# Patient Record
Sex: Female | Born: 1970
Health system: Southern US, Community
[De-identification: ages and names within clinical notes are randomized; demographics above are authoritative.]

## PROBLEM LIST (undated history)

## (undated) DIAGNOSIS — E669 Obesity, unspecified: Secondary | ICD-10-CM

## (undated) DIAGNOSIS — F419 Anxiety disorder, unspecified: Secondary | ICD-10-CM

## (undated) DIAGNOSIS — G2581 Restless legs syndrome: Secondary | ICD-10-CM

## (undated) DIAGNOSIS — E7800 Pure hypercholesterolemia, unspecified: Secondary | ICD-10-CM

## (undated) DIAGNOSIS — K219 Gastro-esophageal reflux disease without esophagitis: Secondary | ICD-10-CM

## (undated) HISTORY — PX: TRIGGER FINGER RELEASE: SHX641

## (undated) HISTORY — PX: ABDOMINAL HYSTERECTOMY: SHX81

## (undated) HISTORY — PX: BREAST SURGERY: SHX581

---

## 2009-07-29 ENCOUNTER — Emergency Department (HOSPITAL_BASED_OUTPATIENT_CLINIC_OR_DEPARTMENT_OTHER): Admission: EM | Admit: 2009-07-29 | Discharge: 2009-07-29 | Payer: Self-pay | Admitting: Emergency Medicine

## 2009-07-29 ENCOUNTER — Ambulatory Visit: Payer: Self-pay | Admitting: Diagnostic Radiology

## 2011-01-10 LAB — BASIC METABOLIC PANEL
BUN: 11 mg/dL (ref 6–23)
CO2: 29 mEq/L (ref 19–32)
Calcium: 9 mg/dL (ref 8.4–10.5)
Chloride: 104 mEq/L (ref 96–112)
Creatinine, Ser: 0.8 mg/dL (ref 0.4–1.2)
GFR calc Af Amer: >60 mL/min (ref >60)
GFR calc non Af Amer: >60 mL/min (ref >60)
Glucose, Bld: 108 mg/dL — ABNORMAL HIGH (ref 70–99)
Potassium: 4.2 mEq/L (ref 3.5–5.1)
Sodium: 142 mEq/L (ref 135–145)

## 2011-01-10 LAB — POCT CARDIAC MARKERS
CKMB, poc: 1.4 ng/mL (ref 1.0–8.0)
Myoglobin, poc: 89.8 ng/mL (ref 12–200)
Troponin i, poc: <0.05 ng/mL (ref 0.00–0.09)

## 2011-01-10 LAB — D-DIMER, QUANTITATIVE: D-Dimer, Quant: 0.33 ug/mL-FEU (ref 0.00–0.48)

## 2011-01-10 LAB — CBC
HCT: 39 % (ref 36.0–46.0)
Hemoglobin: 13.2 g/dL (ref 12.0–15.0)
MCHC: 33.8 g/dL (ref 30.0–36.0)
MCV: 87.8 fL (ref 78.0–100.0)
Platelets: 311 10*3/uL (ref 150–400)
RBC: 4.44 MIL/uL (ref 3.87–5.11)
RDW: 12.7 % (ref 11.5–15.5)
WBC: 10 10*3/uL (ref 4.0–10.5)

## 2018-04-13 ENCOUNTER — Encounter (HOSPITAL_BASED_OUTPATIENT_CLINIC_OR_DEPARTMENT_OTHER): Payer: Self-pay | Admitting: Emergency Medicine

## 2018-04-13 ENCOUNTER — Emergency Department (HOSPITAL_BASED_OUTPATIENT_CLINIC_OR_DEPARTMENT_OTHER): Payer: BLUE CROSS/BLUE SHIELD

## 2018-04-13 ENCOUNTER — Emergency Department (HOSPITAL_BASED_OUTPATIENT_CLINIC_OR_DEPARTMENT_OTHER)
Admission: EM | Admit: 2018-04-13 | Discharge: 2018-04-13 | Disposition: A | Discharge status: Home / Self Care | Payer: BLUE CROSS/BLUE SHIELD | Attending: Emergency Medicine | Admitting: Emergency Medicine

## 2018-04-13 ENCOUNTER — Other Ambulatory Visit: Payer: Self-pay

## 2018-04-13 DIAGNOSIS — N200 Calculus of kidney: Secondary | ICD-10-CM | POA: Insufficient documentation

## 2018-04-13 DIAGNOSIS — Z79899 Other long term (current) drug therapy: Secondary | ICD-10-CM | POA: Insufficient documentation

## 2018-04-13 DIAGNOSIS — R109 Unspecified abdominal pain: Secondary | ICD-10-CM | POA: Diagnosis present

## 2018-04-13 HISTORY — DX: Anxiety disorder, unspecified: F41.9

## 2018-04-13 HISTORY — DX: Pure hypercholesterolemia, unspecified: E78.00

## 2018-04-13 HISTORY — DX: Obesity, unspecified: E66.9

## 2018-04-13 HISTORY — DX: Gastro-esophageal reflux disease without esophagitis: K21.9

## 2018-04-13 HISTORY — DX: Restless legs syndrome: G25.81

## 2018-04-13 LAB — CBC WITH DIFFERENTIAL/PLATELET
Basophils Absolute: 0 10*3/uL (ref 0.0–0.1)
Basophils Relative: 0 %
Eosinophils Absolute: 0.2 10*3/uL (ref 0.0–0.7)
Eosinophils Relative: 3 %
HCT: 39.5 % (ref 36.0–46.0)
Hemoglobin: 13.3 g/dL (ref 12.0–15.0)
Lymphocytes Relative: 29 %
Lymphs Abs: 2.2 10*3/uL (ref 0.7–4.0)
MCH: 29.4 pg (ref 26.0–34.0)
MCHC: 33.7 g/dL (ref 30.0–36.0)
MCV: 87.2 fL (ref 78.0–100.0)
Monocytes Absolute: 0.6 10*3/uL (ref 0.1–1.0)
Monocytes Relative: 8 %
Neutro Abs: 4.4 10*3/uL (ref 1.7–7.7)
Neutrophils Relative %: 60 %
Platelets: 324 10*3/uL (ref 150–400)
RBC: 4.53 MIL/uL (ref 3.87–5.11)
RDW: 12.8 % (ref 11.5–15.5)
WBC: 7.4 10*3/uL (ref 4.0–10.5)

## 2018-04-13 LAB — BASIC METABOLIC PANEL
Anion gap: 7 (ref 5–15)
BUN: 12 mg/dL (ref 6–20)
CO2: 28 mmol/L (ref 22–32)
Calcium: 9.1 mg/dL (ref 8.9–10.3)
Chloride: 104 mmol/L (ref 98–111)
Creatinine, Ser: 0.91 mg/dL (ref 0.44–1.00)
GFR calc Af Amer: >60 mL/min (ref >60)
GFR calc non Af Amer: >60 mL/min (ref >60)
Glucose, Bld: 119 mg/dL — ABNORMAL HIGH (ref 70–99)
Potassium: 3.9 mmol/L (ref 3.5–5.1)
Sodium: 139 mmol/L (ref 135–145)

## 2018-04-13 LAB — URINALYSIS, MICROSCOPIC (REFLEX): RBC / HPF: >50 RBC/hpf (ref 0–5)

## 2018-04-13 LAB — URINALYSIS, ROUTINE W REFLEX MICROSCOPIC
Bilirubin Urine: NEGATIVE
Glucose, UA: NEGATIVE mg/dL
Ketones, ur: NEGATIVE mg/dL
Leukocytes, UA: NEGATIVE
Nitrite: NEGATIVE
Protein, ur: 30 mg/dL — AB
Specific Gravity, Urine: 1.025 (ref 1.005–1.030)
pH: 6 (ref 5.0–8.0)

## 2018-04-13 MED ORDER — KETOROLAC TROMETHAMINE 15 MG/ML IJ SOLN
INTRAMUSCULAR | Status: AC
  Administered 2018-04-13: 15 mg
  Filled 2018-04-13: qty 1

## 2018-04-13 MED ORDER — KETOROLAC TROMETHAMINE 30 MG/ML IJ SOLN: 15.0000 mg | Freq: Once | INTRAMUSCULAR | Status: DC

## 2018-04-13 MED ORDER — HYDROCODONE-ACETAMINOPHEN 5-325 MG PO TABS: 1.0000 | ORAL_TABLET | Freq: Three times a day (TID) | ORAL | 0 refills | Status: AC | PRN

## 2018-04-13 NOTE — ED Provider Notes (Signed)
[facility] EMERGENCY DEPARTMENT Provider Note   CSN: [id number] Arrival date & time: 04/13/18  0941     History   Chief Complaint Chief Complaint  Patient presents with  . Flank Pain    HPI Cassidy Callahan is a 47 y.o. female.  HPI Patient presents with left flank/abdominal pain.  Began while at work today.  States she went to the bathroom to see if that would help and there was no change.  States she only had a small bowel movement and had some urination.  No fevers or chills.  Pain continues.  Is crampy.  No vaginal bleeding or discharge.  States she has had a partial hysterectomy.  Has had pains like this before but states it was her ovarian cyst.  Does not know if she still has her ovary. Past Medical History:  Diagnosis Date  . Anxiety   . GERD (gastroesophageal reflux disease)   . High cholesterol   . Obesity   . Restless leg syndrome     There are no active problems to display for this patient.   Past Surgical History:  Procedure Laterality Date  . ABDOMINAL HYSTERECTOMY    . BREAST SURGERY    . CESAREAN SECTION    . TRIGGER FINGER RELEASE       OB History   None      Home Medications    Prior to Admission medications   Medication Sig Start Date End Date Taking? Authorizing Provider  clonazePAM (KLONOPIN PO) Take by mouth daily as needed.   Yes [provider]  dexlansoprazole (DEXILANT) 60 MG capsule Take 60 mg by mouth daily.   Yes [provider]  Evolocumab (REPATHA [place]) Inject into the skin every 30 (thirty) days.   Yes [provider]  ROPINIROLE HCL PO Take by mouth.   Yes [provider]  HYDROcodone-acetaminophen (NORCO/VICODIN) 5-325 MG tablet Take 1-2 tablets by mouth every 8 (eight) hours as needed for moderate pain. 04/13/18   Benjiman Core, MD    Family History No family history on file.  Social History Social History   Tobacco Use  . Smoking status: Never Smoker  . Smokeless  tobacco: Never Used  Substance Use Topics  . Alcohol use: Never    Frequency: Never  . Drug use: Never     Allergies   Patient has no known allergies.   Review of Systems Review of Systems  Constitutional: Negative for appetite change.  HENT: Negative for congestion.   Cardiovascular: Negative for chest pain.  Gastrointestinal: Positive for abdominal pain. Negative for nausea and vomiting.  Genitourinary: Positive for flank pain. Negative for dysuria, vaginal bleeding and vaginal discharge.  Musculoskeletal: Negative for back pain.  Neurological: Negative for weakness.  Psychiatric/Behavioral: Negative for confusion.     Physical Exam Updated Vital Signs BP 126/84 (BP Location: Left Arm)   Pulse 79   Temp 98.4 F (36.9 C) (Oral)   Resp 16   Ht 5\' 2"  (1.575 m)   Wt 100.2 kg (221 lb)   SpO2 100%   BMI 40.42 kg/m   Physical Exam  Constitutional: She appears well-developed.  HENT:  Head: Normocephalic.  Eyes: Pupils are equal, round, and reactive to light.  Neck: Neck supple.  Cardiovascular: Normal rate.  Pulmonary/Chest: Effort normal.  Abdominal: There is tenderness.  Mild left lower abdominal tenderness.  Mild left mid abdominal tenderness.  Genitourinary:  Genitourinary Comments: CVA tenderness on left side.  Skin: Skin is warm. Capillary  refill takes less than 2 seconds.     ED Treatments / Results  Labs (all labs ordered are listed, but only abnormal results are displayed) Labs Reviewed  BASIC METABOLIC PANEL - Abnormal; Notable for the following components:      Result Value   Glucose, Bld 119 (*)    All other components within normal limits  URINALYSIS, ROUTINE W REFLEX MICROSCOPIC - Abnormal; Notable for the following components:   APPearance CLOUDY (*)    Hgb urine dipstick LARGE (*)    Protein, ur 30 (*)    All other components within normal limits  URINALYSIS, MICROSCOPIC (REFLEX) - Abnormal; Notable for the following components:    Bacteria, UA MANY (*)    All other components within normal limits  CBC WITH DIFFERENTIAL/PLATELET    EKG None  Radiology Ct Renal Stone Study  Result Date: 04/13/2018 CLINICAL DATA:  47 year old female with intermittent left flank pain with sudden onset this morning. Radiates to left lower quadrant. Prior abdominal hysterectomy. Initial encounter. EXAM: CT ABDOMEN AND PELVIS WITHOUT CONTRAST TECHNIQUE: Multidetector CT imaging of the abdomen and pelvis was performed following the standard protocol without IV contrast. COMPARISON:  None. FINDINGS: Lower chest: Minimal atelectasis/scarring lung bases. Heart size within normal limits. Hepatobiliary: Top-normal size fatty liver. Taking into account limitation by non contrast imaging, no worrisome hepatic lesion. No calcified gallstone. Pancreas: Taking into account limitation by non contrast imaging, no worrisome pancreatic mass or inflammation. Spleen: Taking into account limitation by non contrast imaging, no splenic mass or enlargement. Adrenals/Urinary Tract: 2.6 mm distal left ureteral obstructing stone located 1.5 cm proximal to the left ureteral vesical junction causing mild left hydroureteronephrosis. Tiny left-sided and possibly tiny right-sided nonobstructing renal calculi. Taking into account limitation by non contrast imaging, no renal mass. No adrenal mass. Contracted urinary bladder with circumferential wall thickening. Evaluation limited. Stomach/Bowel: Majority of colon is under distended as are portions of small bowel and stomach limiting evaluation. No extraluminal bowel inflammatory process. Vascular/Lymphatic: Atherosclerotic changes aorta and aortic branch vessels. No abdominal aortic aneurysm. Scattered normal size lymph nodes without adenopathy. Reproductive: Post hysterectomy. Left ovarian cystic structure measures up to 4.5 cm. Other: No free air or bowel containing hernia. Musculoskeletal: Degenerative changes lower lumbar spine.  IMPRESSION: 2.6 mm distal left ureteral obstructing stone located 1.5 cm proximal to the left ureteral vesical junction causing mild left hydroureteronephrosis. Tiny left-sided and possibly tiny right-sided nonobstructing renal calculi. Left ovarian cystic structure measures up to 4.5 cm. Follow-up pelvic sonogram in 6-12 weeks recommended for further delineation. Fatty liver. Aortic Atherosclerosis (ICD10-I70.0). Electronically Signed   By: Lacy Duverney M.D.   On: 04/13/2018 11:17    Procedures Procedures (including critical care time)  Medications Ordered in ED Medications  ketorolac (TORADOL) 30 MG/ML injection 15 mg (15 mg Intravenous Not Given 04/13/18 1150)  ketorolac (TORADOL) 15 MG/ML injection (15 mg  Given 04/13/18 1150)     Initial Impression / Assessment and Plan / ED Course  I have reviewed the triage vital signs and the nursing notes.  Pertinent labs & imaging results that were available during my care of the patient were reviewed by me and considered in my medical decision making (see chart for details).     Patient with left-sided flank pain.  Kidney stone on imaging.  No infection and lab work otherwise reassuring.  Pain improved.  Will discharge home.  Follow-up with urology.  Final Clinical Impressions(s) / ED Diagnoses   Final diagnoses:  Kidney stone  ED Discharge Orders        Ordered    HYDROcodone-acetaminophen (NORCO/VICODIN) 5-325 MG tablet  Every 8 hours PRN     04/13/18 1224       Benjiman CorePickering, Fredonia Casalino, MD 04/13/18 1226

## 2018-04-13 NOTE — ED Notes (Signed)
ED Provider at bedside. 

## 2018-04-13 NOTE — ED Triage Notes (Signed)
Intermittent Left flank pain with sudden onset this morning at work.  Radiates to LLQ.  No hx of this. Denies n/v/d.  Denies urinary sx.  Had episode of diaphoresis.

## 2018-04-13 NOTE — ED Notes (Signed)
ED Provider at bedside discussing test results and plan of care for dispo.  

## 2018-05-07 ENCOUNTER — Other Ambulatory Visit: Payer: Self-pay

## 2018-05-07 ENCOUNTER — Encounter (HOSPITAL_BASED_OUTPATIENT_CLINIC_OR_DEPARTMENT_OTHER): Payer: Self-pay

## 2018-05-07 ENCOUNTER — Emergency Department (HOSPITAL_BASED_OUTPATIENT_CLINIC_OR_DEPARTMENT_OTHER)
Admission: EM | Admit: 2018-05-07 | Discharge: 2018-05-07 | Disposition: A | Discharge status: Home / Self Care | Payer: BLUE CROSS/BLUE SHIELD | Attending: Emergency Medicine | Admitting: Emergency Medicine

## 2018-05-07 DIAGNOSIS — J029 Acute pharyngitis, unspecified: Secondary | ICD-10-CM | POA: Diagnosis present

## 2018-05-07 DIAGNOSIS — Z79899 Other long term (current) drug therapy: Secondary | ICD-10-CM | POA: Insufficient documentation

## 2018-05-07 DIAGNOSIS — R519 Headache, unspecified: Secondary | ICD-10-CM

## 2018-05-07 DIAGNOSIS — R51 Headache: Secondary | ICD-10-CM | POA: Diagnosis not present

## 2018-05-07 MED ORDER — MELOXICAM 15 MG PO TABS: 15.0000 mg | ORAL_TABLET | Freq: Every day | ORAL | 0 refills | Status: AC

## 2018-05-07 MED FILL — MELOXICAM 15 MG TABLET: 15 | 30 days supply | Qty: 30 | Fill #0

## 2018-05-07 NOTE — ED Triage Notes (Signed)
C/o URI sx x 4 days-NAD-steady gait 

## 2018-05-07 NOTE — ED Provider Notes (Signed)
[facility] EMERGENCY DEPARTMENT Provider Note   CSN: [id number] Arrival date & time: 05/07/18  1616     History   Chief Complaint Chief Complaint  Patient presents with  . URI    HPI Rhetta MuraStacey Y Fragoso is a 47 y.o. female.  HPI Rhetta MuraStacey Y Evon is a 47 y.o. female with hx of GERD and anxiety, presents to ED with complaint of pain to the neck, ear, throat on the left. States symptoms started 1 week ago. States pain is worse with talking, swallowing, sneezing. Hx of same once in the past, has not been seen there. No other URI symptoms.  Has been taking ibuprofen and tried Excedrin with no relief of her pain.  She denies any visual changes.  No fever or chills.  No trismus.  No opening or closing her mouth.  No pain with chewing.  Reports some pain with turning her neck.  States she is not having any difficulty swallowing and does not feel like there is something in her throat.  Past Medical History:  Diagnosis Date  . Anxiety   . GERD (gastroesophageal reflux disease)   . High cholesterol   . Obesity   . Restless leg syndrome     There are no active problems to display for this patient.   Past Surgical History:  Procedure Laterality Date  . ABDOMINAL HYSTERECTOMY    . BREAST SURGERY    . CESAREAN SECTION    . TRIGGER FINGER RELEASE       OB History   None      Home Medications    Prior to Admission medications   Medication Sig Start Date End Date Taking? Authorizing Provider  clonazePAM (KLONOPIN PO) Take by mouth daily as needed.    [provider]  dexlansoprazole (DEXILANT) 60 MG capsule Take 60 mg by mouth daily.    [provider]  Evolocumab (REPATHA [place]) Inject into the skin every 30 (thirty) days.    [provider]  HYDROcodone-acetaminophen (NORCO/VICODIN) 5-325 MG tablet Take 1-2 tablets by mouth every 8 (eight) hours as needed for moderate pain. 04/13/18   Benjiman CorePickering, Nathan, MD  ROPINIROLE HCL PO Take by mouth.     [provider]    Family History No family history on file.  Social History Social History   Tobacco Use  . Smoking status: Never Smoker  . Smokeless tobacco: Never Used  Substance Use Topics  . Alcohol use: Never    Frequency: Never  . Drug use: Never     Allergies   Patient has no known allergies.   Review of Systems Review of Systems  Constitutional: Negative for chills and fever.  HENT: Positive for ear pain, facial swelling and sore throat.   Eyes: Negative for photophobia and visual disturbance.  Respiratory: Negative.   Cardiovascular: Negative.   Gastrointestinal: Negative.   Neurological: Positive for headaches. Negative for dizziness and light-headedness.  All other systems reviewed and are negative.    Physical Exam Updated Vital Signs BP 107/90 (BP Location: Left Arm)   Pulse 90   Temp 98.5 F (36.9 C) (Oral)   Resp 18   Ht 5\' 2"  (1.575 m)   Wt 103 kg (227 lb)   SpO2 99%   BMI 41.52 kg/m   Physical Exam  Constitutional: She appears well-developed and well-nourished. No distress.  HENT:  Head: Normocephalic.  No temporal tenderness.  No tenderness over TMJ.  No obvious facial swelling.  No trismus.  Normal  teeth.  No submandibular swelling or fullness.  No lymphadenopathy.  TM is normal.  Eyes: Pupils are equal, round, and reactive to light. Conjunctivae and EOM are normal.  Neck: Neck supple.  Cardiovascular: Normal rate, regular rhythm and normal heart sounds.  Pulmonary/Chest: Effort normal and breath sounds normal. No respiratory distress. She has no wheezes. She has no rales.  Abdominal: Soft. Bowel sounds are normal. She exhibits no distension. There is no tenderness. There is no rebound.  Musculoskeletal: She exhibits no edema.  Neurological: She is alert.  Skin: Skin is warm and dry.  Psychiatric: She has a normal mood and affect. Her behavior is normal.  Nursing note and vitals reviewed.    ED Treatments / Results   Labs (all labs ordered are listed, but only abnormal results are displayed) Labs Reviewed - No data to display  EKG None  Radiology No results found.  Procedures Procedures (including critical care time)  Medications Ordered in ED Medications - No data to display   Initial Impression / Assessment and Plan / ED Course  I have reviewed the triage vital signs and the nursing notes.  Pertinent labs & imaging results that were available during my care of the patient were reviewed by me and considered in my medical decision making (see chart for details).    With pain to the left side of the face and neck.  Her exam is really unremarkable.  I have low concern about temporal arteritis given her age and no temporal tenderness and no visual changes.  She has no pain with TMJ palpation or movement to her mandible.  Her teeth look unremarkable.  There is no lymphadenopathy.  Her TM and ear looks normal.  I considered that she may have trigeminal neuralgia, however her pain does not appear to be severe.  It is only worse with talking and swallowing and sneezing and moving her head.  Question whether she may have musculoskeletal pain.  There is no infectious symptoms or exam findings.  I will start her on NSAIDs and will have a follow-up with her ENT doctor.  Vitals:   05/07/18 1622 05/07/18 1625  BP:  107/90  Pulse:  90  Resp:  18  Temp:  98.5 F (36.9 C)  TempSrc:  Oral  SpO2:  99%  Weight: 103 kg (227 lb)   Height: 5\' 2"  (1.575 m)     Final Clinical Impressions(s) / ED Diagnoses   Final diagnoses:  Facial pain    ED Discharge Orders        Ordered    meloxicam (MOBIC) 15 MG tablet  Daily     05/07/18 1742       Jaynie Crumble, PA-C 05/07/18 1743    Gwyneth Sprout, MD 05/08/18 [id number]

## 2018-05-07 NOTE — Discharge Instructions (Addendum)
Take Mobic daily for pain and inflammation.  Avoid any hard foods.  Please follow-up with your ENT doctor.  Return if any worsening symptoms

## 2021-03-01 ENCOUNTER — Emergency Department (HOSPITAL_BASED_OUTPATIENT_CLINIC_OR_DEPARTMENT_OTHER): Payer: BC Managed Care – PPO

## 2021-03-01 ENCOUNTER — Emergency Department (HOSPITAL_BASED_OUTPATIENT_CLINIC_OR_DEPARTMENT_OTHER)
Admission: EM | Admit: 2021-03-01 | Discharge: 2021-03-01 | Disposition: A | Discharge status: Home / Self Care | Payer: BC Managed Care – PPO | Attending: Emergency Medicine | Admitting: Emergency Medicine

## 2021-03-01 ENCOUNTER — Other Ambulatory Visit: Payer: Self-pay

## 2021-03-01 ENCOUNTER — Encounter (HOSPITAL_BASED_OUTPATIENT_CLINIC_OR_DEPARTMENT_OTHER): Payer: Self-pay | Admitting: Urology

## 2021-03-01 DIAGNOSIS — R1031 Right lower quadrant pain: Secondary | ICD-10-CM | POA: Diagnosis present

## 2021-03-01 DIAGNOSIS — N200 Calculus of kidney: Secondary | ICD-10-CM

## 2021-03-01 DIAGNOSIS — K219 Gastro-esophageal reflux disease without esophagitis: Secondary | ICD-10-CM | POA: Diagnosis not present

## 2021-03-01 DIAGNOSIS — N132 Hydronephrosis with renal and ureteral calculous obstruction: Secondary | ICD-10-CM | POA: Diagnosis not present

## 2021-03-01 LAB — CBC WITH DIFFERENTIAL/PLATELET
Abs Immature Granulocytes: 0.05 10*3/uL (ref 0.00–0.07)
Basophils Absolute: 0.1 10*3/uL (ref 0.0–0.1)
Basophils Relative: 1 %
Eosinophils Absolute: 0.3 10*3/uL (ref 0.0–0.5)
Eosinophils Relative: 3 %
HCT: 42.5 % (ref 36.0–46.0)
Hemoglobin: 13.8 g/dL (ref 12.0–15.0)
Immature Granulocytes: 1 %
Lymphocytes Relative: 40 %
Lymphs Abs: 3.9 10*3/uL (ref 0.7–4.0)
MCH: 28.6 pg (ref 26.0–34.0)
MCHC: 32.5 g/dL (ref 30.0–36.0)
MCV: 88 fL (ref 80.0–100.0)
Monocytes Absolute: 0.7 10*3/uL (ref 0.1–1.0)
Monocytes Relative: 7 %
Neutro Abs: 4.7 10*3/uL (ref 1.7–7.7)
Neutrophils Relative %: 48 %
Platelets: 416 10*3/uL — ABNORMAL HIGH (ref 150–400)
RBC: 4.83 MIL/uL (ref 3.87–5.11)
RDW: 12.2 % (ref 11.5–15.5)
WBC: 9.6 10*3/uL (ref 4.0–10.5)
nRBC: 0 % (ref 0.0–0.2)

## 2021-03-01 LAB — URINALYSIS, ROUTINE W REFLEX MICROSCOPIC
Bilirubin Urine: NEGATIVE
Glucose, UA: NEGATIVE mg/dL
Ketones, ur: NEGATIVE mg/dL
Leukocytes,Ua: NEGATIVE
Nitrite: NEGATIVE
Protein, ur: NEGATIVE mg/dL
Specific Gravity, Urine: 1.02 (ref 1.005–1.030)
pH: 5.5 (ref 5.0–8.0)

## 2021-03-01 LAB — COMPREHENSIVE METABOLIC PANEL
ALT: 40 U/L (ref 0–44)
AST: 33 U/L (ref 15–41)
Albumin: 4.4 g/dL (ref 3.5–5.0)
Alkaline Phosphatase: 100 U/L (ref 38–126)
Anion gap: 11 (ref 5–15)
BUN: 9 mg/dL (ref 6–20)
CO2: 25 mmol/L (ref 22–32)
Calcium: 9.9 mg/dL (ref 8.9–10.3)
Chloride: 102 mmol/L (ref 98–111)
Creatinine, Ser: 0.78 mg/dL (ref 0.44–1.00)
GFR, Estimated: >60 mL/min (ref >60)
Glucose, Bld: 191 mg/dL — ABNORMAL HIGH (ref 70–99)
Potassium: 3.9 mmol/L (ref 3.5–5.1)
Sodium: 138 mmol/L (ref 135–145)
Total Bilirubin: 0.7 mg/dL (ref 0.3–1.2)
Total Protein: 8.1 g/dL (ref 6.5–8.1)

## 2021-03-01 LAB — URINALYSIS, MICROSCOPIC (REFLEX): RBC / HPF: >50 RBC/hpf (ref 0–5)

## 2021-03-01 LAB — LIPASE, BLOOD: Lipase: 24 U/L (ref 11–51)

## 2021-03-01 MED ORDER — MORPHINE SULFATE (PF) 4 MG/ML IV SOLN
4.0000 mg | Freq: Once | INTRAVENOUS | Status: AC
  Administered 2021-03-01: 4 mg via INTRAVENOUS
  Filled 2021-03-01: qty 1

## 2021-03-01 MED ORDER — SODIUM CHLORIDE 0.9 % IV BOLUS
1000.0000 mL | Freq: Once | INTRAVENOUS | Status: AC
  Administered 2021-03-01: 1000 mL via INTRAVENOUS

## 2021-03-01 MED ORDER — ONDANSETRON 4 MG PO TBDP: 4.0000 mg | ORAL_TABLET | Freq: Three times a day (TID) | ORAL | 0 refills | Status: AC | PRN

## 2021-03-01 MED ORDER — OXYCODONE HCL 5 MG PO TABS: 5.0000 mg | ORAL_TABLET | ORAL | 0 refills | Status: AC | PRN

## 2021-03-01 MED ORDER — HYDROMORPHONE HCL 1 MG/ML IJ SOLN
0.5000 mg | Freq: Once | INTRAMUSCULAR | Status: AC
  Administered 2021-03-01: 0.5 mg via INTRAVENOUS
  Filled 2021-03-01: qty 1

## 2021-03-01 MED ORDER — KETOROLAC TROMETHAMINE 30 MG/ML IJ SOLN
30.0000 mg | Freq: Once | INTRAMUSCULAR | Status: AC
  Administered 2021-03-01: 30 mg via INTRAVENOUS
  Filled 2021-03-01: qty 1

## 2021-03-01 MED ORDER — ONDANSETRON HCL 4 MG/2ML IJ SOLN
4.0000 mg | Freq: Once | INTRAMUSCULAR | Status: AC
  Administered 2021-03-01: 4 mg via INTRAVENOUS
  Filled 2021-03-01: qty 2

## 2021-03-01 MED ORDER — HYDROMORPHONE HCL 1 MG/ML IJ SOLN: 1.0000 mg | Freq: Once | INTRAMUSCULAR | Status: DC

## 2021-03-01 MED ORDER — TAMSULOSIN HCL 0.4 MG PO CAPS: 0.4000 mg | ORAL_CAPSULE | Freq: Every day | ORAL | 0 refills | Status: AC

## 2021-03-01 NOTE — ED Provider Notes (Signed)
[facility] EMERGENCY DEPARTMENT Provider Note   CSN: [id number] Arrival date & time: 03/01/21  1536    History Chief Complaint  Patient presents with  . Groin Pain    Rhetta MuraStacey Y Raborn is a 50 y.o. female with past medical history significant for GERD, obesity, kidney stones who presents for evaluation of RLQ pain at her groin.  Began 2 hours PTA.  Described as sharp and stabbing.  She is unable to get comfortable in bed.  Feels like she has had some increase in urination.  No hematuria, dysuria. She has had a hysterectomy.  Has had ovarian cysts previously. No flank pain.  No fever, chills, nausea, vomiting, chest pain, shortness of breath, back pain, pain to lower extremities, paresthesias or weakness.  She has not taken anything for her symptoms.  She denies any additional aggravating or alleviating factors.  Rates her current pain a 10/10.  History obtained from patient and past medical records.  No interpreter used.  HPI     Past Medical History:  Diagnosis Date  . Anxiety   . GERD (gastroesophageal reflux disease)   . High cholesterol   . Obesity   . Restless leg syndrome     There are no problems to display for this patient.   Past Surgical History:  Procedure Laterality Date  . ABDOMINAL HYSTERECTOMY    . BREAST SURGERY    . CESAREAN SECTION    . TRIGGER FINGER RELEASE       OB History   No obstetric history on file.     History reviewed. No pertinent family history.  Social History   Tobacco Use  . Smoking status: Never Smoker  . Smokeless tobacco: Never Used  Substance Use Topics  . Alcohol use: Never  . Drug use: Never    Home Medications Prior to Admission medications   Medication Sig Start Date End Date Taking? Authorizing Provider  ondansetron (ZOFRAN ODT) 4 MG disintegrating tablet Take 1 tablet (4 mg total) by mouth every 8 (eight) hours as needed for nausea or vomiting. 03/01/21  Yes Zeidy Tayag A, PA-C  oxyCODONE  (ROXICODONE) 5 MG immediate release tablet Take 1 tablet (5 mg total) by mouth every 4 (four) hours as needed for up to 3 days for severe pain. 03/01/21 03/04/21 Yes Tharon Kitch A, PA-C  tamsulosin (FLOMAX) 0.4 MG CAPS capsule Take 1 capsule (0.4 mg total) by mouth daily. 03/01/21  Yes Sumayyah Custodio A, PA-C  clonazePAM (KLONOPIN PO) Take by mouth daily as needed.    [provider]  dexlansoprazole (DEXILANT) 60 MG capsule Take 60 mg by mouth daily.    [provider]  Evolocumab (REPATHA Trinity) Inject into the skin every 30 (thirty) days.    [provider]  HYDROcodone-acetaminophen (NORCO/VICODIN) 5-325 MG tablet Take 1-2 tablets by mouth every 8 (eight) hours as needed for moderate pain. 04/13/18   Benjiman CorePickering, Nathan, MD  meloxicam (MOBIC) 15 MG tablet Take 1 tablet (15 mg total) by mouth daily. 05/07/18   Kirichenko, Tatyana, PA-C  ROPINIROLE HCL PO Take by mouth.    [provider]    Allergies    Patient has no known allergies.  Review of Systems   Review of Systems  Constitutional: Negative.   HENT: Negative.   Respiratory: Negative.   Cardiovascular: Negative.   Gastrointestinal: Positive for abdominal pain. Negative for abdominal distention, anal bleeding, blood in stool, constipation, diarrhea, nausea, rectal pain and vomiting.  Genitourinary: Positive for frequency. Negative  for decreased urine volume, difficulty urinating, dysuria, flank pain, hematuria, menstrual problem, pelvic pain, urgency, vaginal bleeding, vaginal discharge and vaginal pain.       Right groin pain  Musculoskeletal: Negative.   Skin: Negative.   Neurological: Negative.   All other systems reviewed and are negative.   Physical Exam Updated Vital Signs BP (!) 139/92   Pulse 75   Temp 98.4 F (36.9 C) (Oral)   Resp 20   Ht 5\' 2"  (1.575 m)   Wt 99.3 kg   SpO2 100%   BMI 40.06 kg/m   Physical Exam Vitals and nursing note reviewed.  Constitutional:      General:  She is not in acute distress.    Appearance: She is well-developed. She is not ill-appearing, toxic-appearing or diaphoretic.     Comments: Appear uncomfortable in room  HENT:     Head: Normocephalic and atraumatic.     Nose: Nose normal.     Mouth/Throat:     Mouth: Mucous membranes are moist.  Eyes:     Pupils: Pupils are equal, round, and reactive to light.  Cardiovascular:     Rate and Rhythm: Normal rate.     Pulses: Normal pulses.     Heart sounds: Normal heart sounds.  Pulmonary:     Effort: Pulmonary effort is normal. No respiratory distress.     Breath sounds: Normal breath sounds.  Abdominal:     General: There is no distension.     Palpations: Abdomen is soft.     Tenderness: There is abdominal tenderness. There is no right CVA tenderness, left CVA tenderness, guarding or rebound.     Comments: Diffuse tenderness to right lower quadrant, right pelvic region.  No overlying skin changes.  Musculoskeletal:        General: Normal range of motion.     Cervical back: Normal range of motion.     Comments: Moves all 4 extremities without difficulty.  No midline spinal tenderness.  Able to flex and extend at bilateral hips and knees.  Skin:    General: Skin is warm and dry.     Capillary Refill: Capillary refill takes less than 2 seconds.  Neurological:     Mental Status: She is alert and oriented to person, place, and time.     ED Results / Procedures / Treatments   Labs (all labs ordered are listed, but only abnormal results are displayed) Labs Reviewed  CBC WITH DIFFERENTIAL/PLATELET - Abnormal; Notable for the following components:      Result Value   Platelets 416 (*)    All other components within normal limits  COMPREHENSIVE METABOLIC PANEL - Abnormal; Notable for the following components:   Glucose, Bld 191 (*)    All other components within normal limits  URINALYSIS, ROUTINE W REFLEX MICROSCOPIC - Abnormal; Notable for the following components:   APPearance  HAZY (*)    Hgb urine dipstick LARGE (*)    All other components within normal limits  URINALYSIS, MICROSCOPIC (REFLEX) - Abnormal; Notable for the following components:   Bacteria, UA FEW (*)    All other components within normal limits  RESP PANEL BY RT-PCR (FLU A&B, COVID) ARPGX2  LIPASE, BLOOD    EKG None  Radiology CT ABDOMEN PELVIS WO CONTRAST  Result Date: 03/01/2021 CLINICAL DATA:  50 year old female with right lower quadrant abdominal pain. EXAM: CT ABDOMEN AND PELVIS WITHOUT CONTRAST TECHNIQUE: Multidetector CT imaging of the abdomen and pelvis was performed following the standard protocol  without IV contrast. COMPARISON:  CT abdomen pelvis dated 03/14/2018. FINDINGS: Evaluation of this exam is limited in the absence of intravenous contrast. Lower chest: The visualized lung bases are clear. No intra-abdominal free air or free fluid. Hepatobiliary: Diffuse fatty liver. No intrahepatic biliary ductal dilatation. The gallbladder is unremarkable. Pancreas: Unremarkable. No pancreatic ductal dilatation or surrounding inflammatory changes. Spleen: Normal in size without focal abnormality. Adrenals/Urinary Tract: The adrenal glands unremarkable. There is a 6 mm stone in the proximal left ureter close to the UPJ. No hydronephrosis. There is a 4 mm stone along the right posterior bladder wall in the region of the ureterovesical junction which may represent a UVJ stone or a recently passed calculus. There is mild right hydronephrosis. No stone identified within the kidneys. The urinary bladder is minimally distended. Stomach/Bowel: There is no bowel obstruction or active inflammation. The appendix is normal. Vascular/Lymphatic: Minimal aortoiliac atherosclerotic disease. The IVC is unremarkable. No portal venous gas. There is no adenopathy. Reproductive: Hysterectomy. No adnexal masses. Other: None Musculoskeletal: No acute or significant osseous findings. IMPRESSION: 1. A 4 mm right UVJ calculus  with mild right hydronephrosis. 2. A 6 mm left UPJ stone.  No hydronephrosis on the left. 3. Fatty liver. 4. No bowel obstruction. Normal appendix. 5. Aortic Atherosclerosis (ICD10-I70.0). Electronically Signed   By: Elgie Collard M.D.   On: 03/01/2021 17:14    Procedures Procedures   Medications Ordered in ED Medications  sodium chloride 0.9 % bolus 1,000 mL (0 mLs Intravenous Stopped 03/01/21 1908)  ondansetron (ZOFRAN) injection 4 mg (4 mg Intravenous Given 03/01/21 1631)  morphine 4 MG/ML injection 4 mg (4 mg Intravenous Given 03/01/21 1631)  HYDROmorphone (DILAUDID) injection 0.5 mg (0.5 mg Intravenous Given 03/01/21 1706)  ketorolac (TORADOL) 30 MG/ML injection 30 mg (30 mg Intravenous Given 03/01/21 1756)   ED Course  I have reviewed the triage vital signs and the nursing notes.  Pertinent labs & imaging results that were available during my care of the patient were reviewed by me and considered in my medical decision making (see chart for details).  50 year old here for evaluation of right lower quadrant, right groin pain which began about 2 hours PTA.  Some associated urinary frequency.  She is afebrile, nonseptic, non-ill-appearing.  Does appear uncomfortable on arrival.  Has had kidney stones previously.  She denies any hematuria.  Prior hysterectomy.  She is unsure if she still has her appendix.  Has had ovarian cyst before.  Per last CT imaging she did have ovaries however the uterus so partial hysterectomy.  Plan on labs, imaging and reassess.  Labs and imaging personally reviewed and interpreted:  CBC without leukocytosis CMP glucose 191 no additional aggravating or alleviating factors. UA Lipase 24 CT AP WO with right 4  UVJ stone with mild hydronephrosis, left 6 mm proximal UVJ stone  Patient reassessed. Pain uncontrolled. Will order additional meds  Patient reassessed. Discussed labs and imaging. Will attempt to get her pain better under control.  Patient reassessed.   Pain significantly improved since given Toradol.  She appears comfortable in room.  She would like p.o. challenge.  Patient reassessed.  She has no pain currently.  She is tolerating p.o. intake.  Will DC home with symptomatic management.  Discussed with patient she will need close outpatient follow-up with urology.  She is urinating here without difficulty. No evidence of infectious process.  Patient is nontoxic, nonseptic appearing, in no apparent distress.  Patient's pain and other symptoms adequately managed in  emergency department.  Fluid bolus given.  Labs, imaging and vitals reviewed.  Patient does not meet the SIRS or Sepsis criteria.  On repeat exam patient does not have a surgical abdomin and there are no peritoneal signs.  No indication of appendicitis, bowel obstruction, bowel perforation, cholecystitis, diverticulitis, torsion.  Patient discharged home with symptomatic treatment and given strict instructions for follow-up with their primary care physician.  I have also discussed reasons to return immediately to the ER.  Patient expresses understanding and agrees with plan.   Patient discussed with attending, Dr. Silverio Lay who agrees with above treatment, plan and disposition   This patient was evaluated during a time of global shortage of iodinated contrast media. Based on guidance from the [employer] of Radiology, best practices, and local institutional approaches an alternative path for evaluating and managing the patient may have been employed in order to provide optimal care during this shortage. The current situation has been discussed with the patient.    MDM Rules/Calculators/A&P                           Final Clinical Impression(s) / ED Diagnoses Final diagnoses:  Kidney stone    Rx / DC Orders ED Discharge Orders         Ordered    tamsulosin (FLOMAX) 0.4 MG CAPS capsule  Daily        03/01/21 1921    ondansetron (ZOFRAN ODT) 4 MG disintegrating tablet  Every 8 hours  PRN        03/01/21 1921    oxyCODONE (ROXICODONE) 5 MG immediate release tablet  Every 4 hours PRN        03/01/21 1921           Prue Lingenfelter A, PA-C 03/01/21 1925    Charlynne Pander, MD 03/01/21 [phone]

## 2021-03-01 NOTE — ED Triage Notes (Signed)
Right Groin pain started 1.5 hrs ago, frequent urination

## 2021-03-01 NOTE — Discharge Instructions (Signed)
Take the medications as prescribed.  Call urology tomorrow to be seen  Return for new or worsening symptoms.

## 2021-03-01 NOTE — ED Notes (Signed)
Urine culture sent to lab.

## 2021-03-01 NOTE — ED Notes (Signed)
Pt. Reports her R side started to hurt at 2:30 today and she left work.  She now hurts into the R groin and R leg.  Pt. Reports she can't pee at this time.  Pt. In lots of pain and rocking back and forth.  Pt.  Reports no kidney stones in past.

## 2021-03-01 NOTE — ED Notes (Signed)
Will hold off on the dilaudid for now due to pain at a 1/10 before the toradol given

## 2021-07-08 IMAGING — CT CT ABD-PELV W/O CM
2 of 4 series · 17 of 46 positions shown, 19 images · non-contrast
Comparison: CT abdomen pelvis dated 03/14/2018.

CLINICAL DATA: 49-year-old female with right lower quadrant
abdominal pain.

EXAM:
CT ABDOMEN AND PELVIS WITHOUT CONTRAST
TECHNIQUE: Multidetector CT imaging of the abdomen and pelvis was performed
following the standard protocol without IV contrast.

[Series 2: axial st · axial · 0.90mm/px · z∈[+294,+694]mm · 14 of 88 slices shown, 16 images]
[im 4/88  soft-tissue]
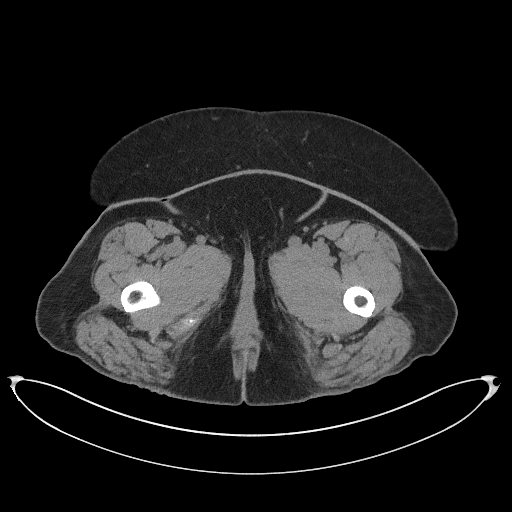
[im 4/88  bone]
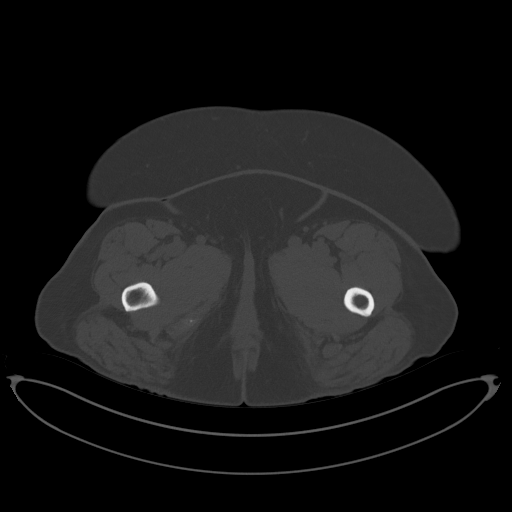
[im 11/88  soft-tissue]
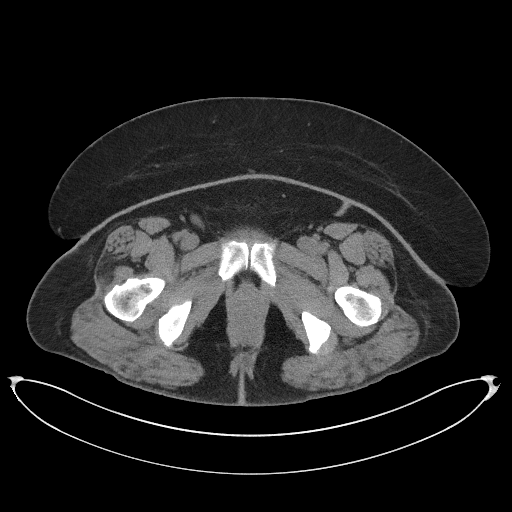
[im 18/88  soft-tissue]
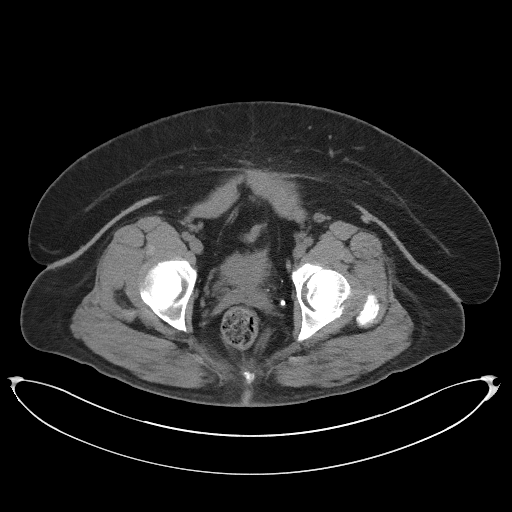
[im 25/88  soft-tissue]
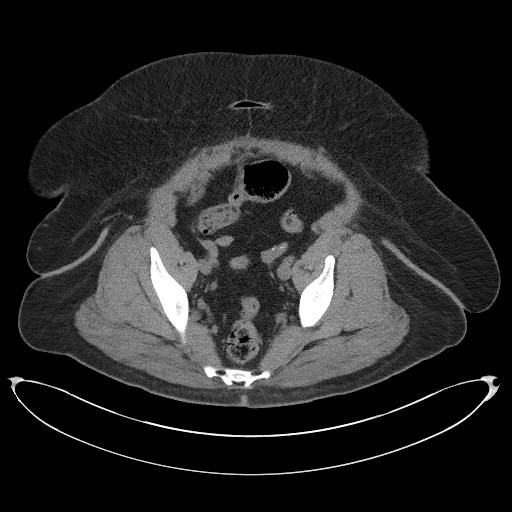
[im 28/88  soft-tissue]
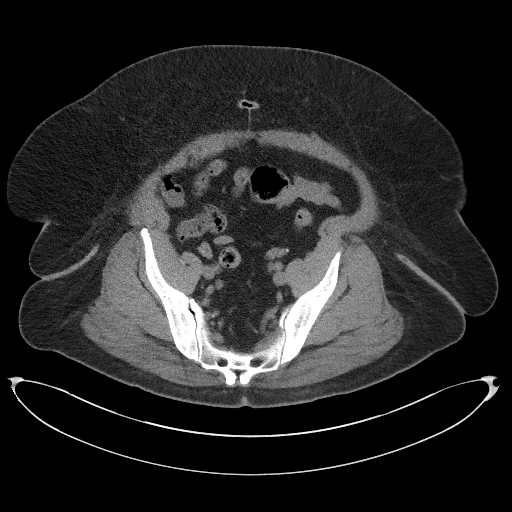
[im 35/88  soft-tissue]
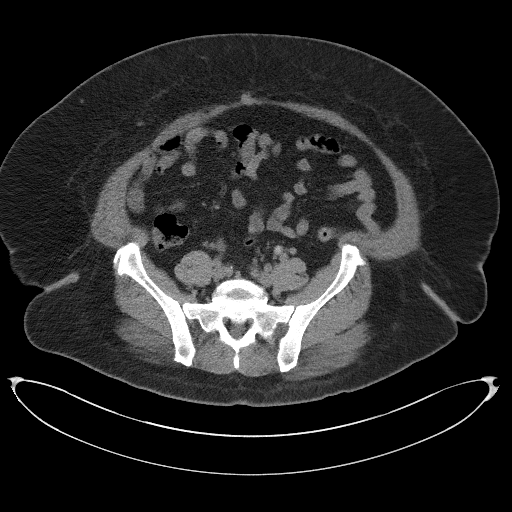
[im 42/88  soft-tissue]
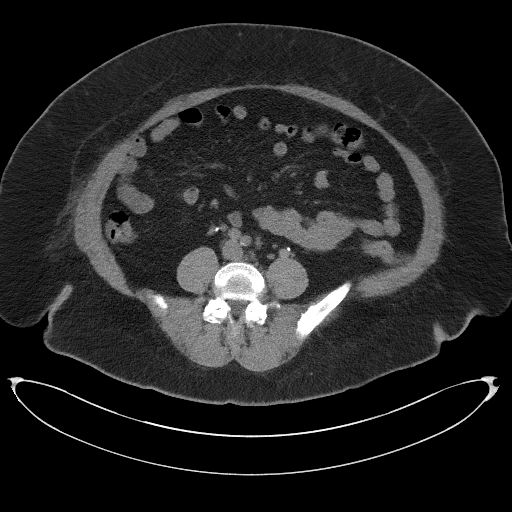
[im 46/88  soft-tissue]
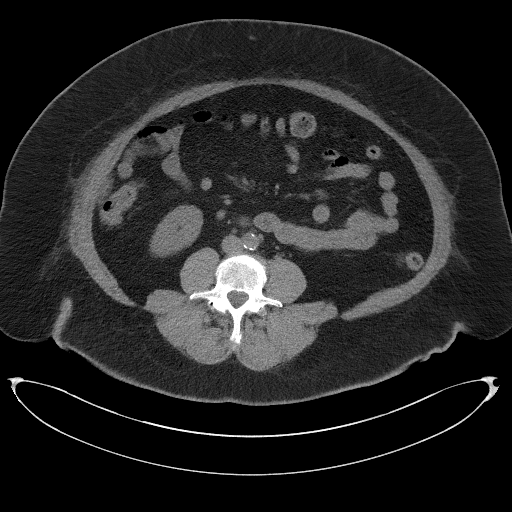
[im 53/88  soft-tissue]
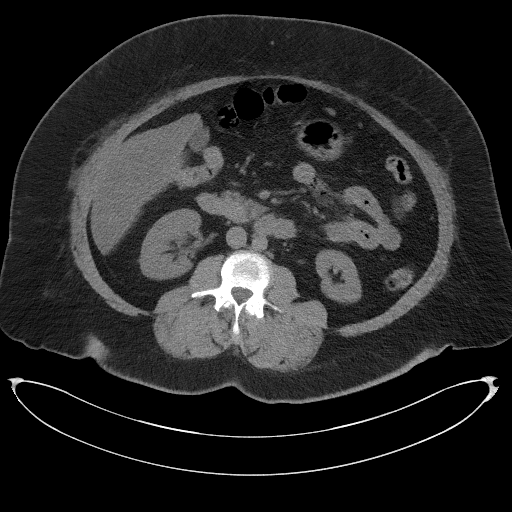
[im 53/88  bone]
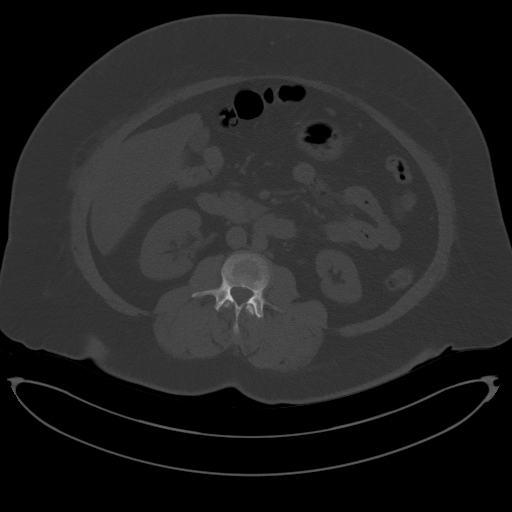
[im 60/88  soft-tissue]
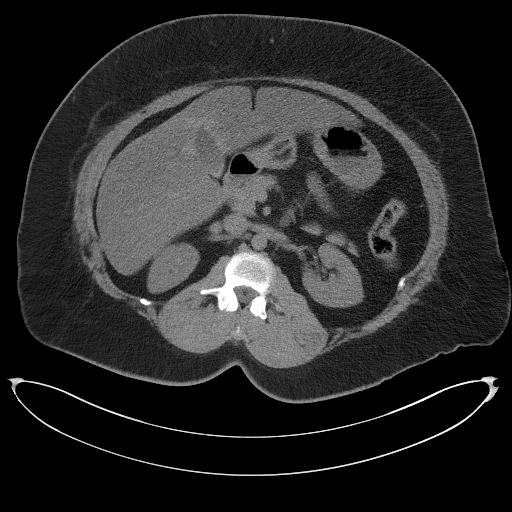
[im 67/88  soft-tissue]
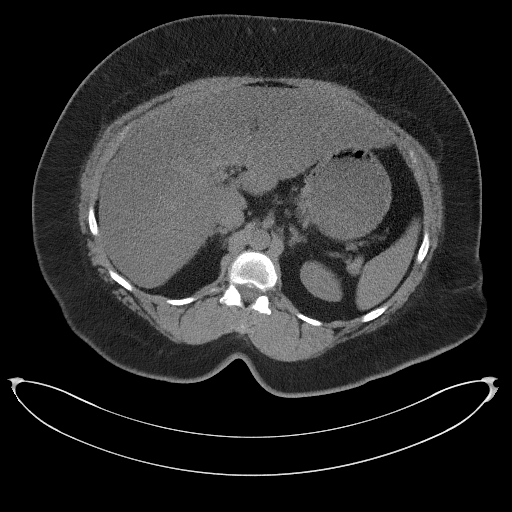
[im 70/88  soft-tissue]
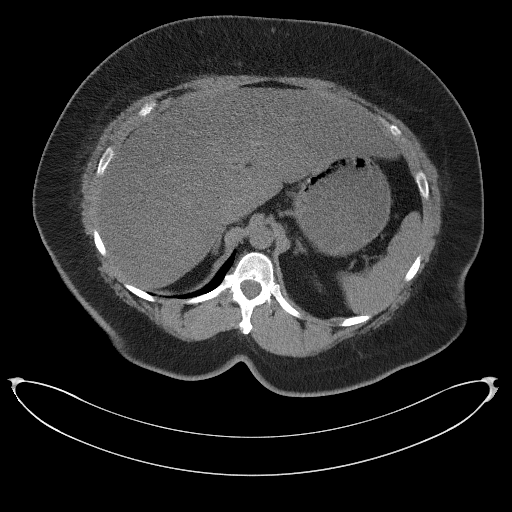
[im 77/88  soft-tissue]
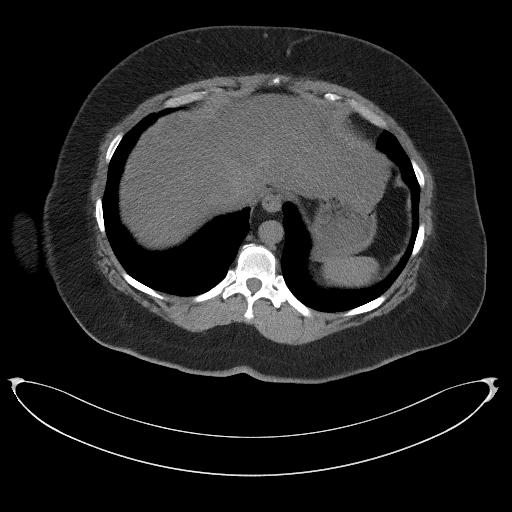
[im 84/88  soft-tissue]
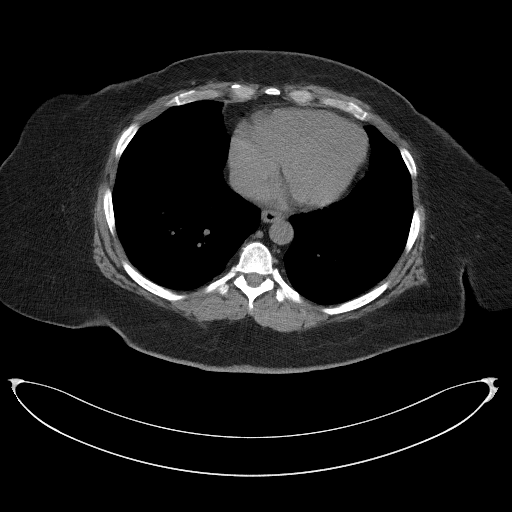

[Series 5: coronal st · coronal · 0.91mm/px · 3 of 125 slices shown]
[im 42/125  soft-tissue]
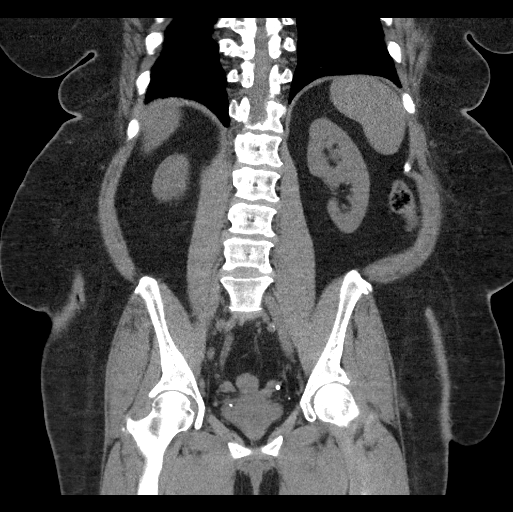
[im 56/125  soft-tissue]
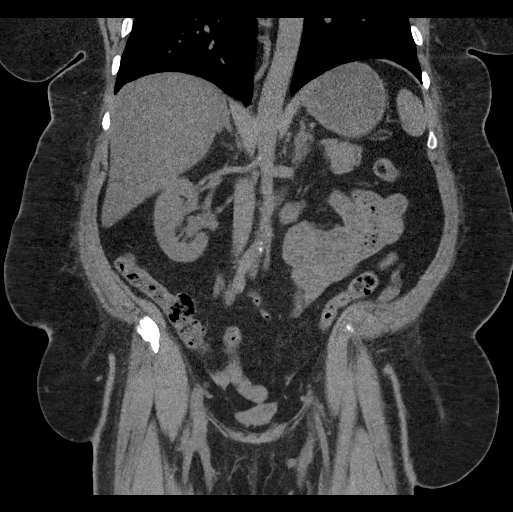
[im 69/125  soft-tissue]
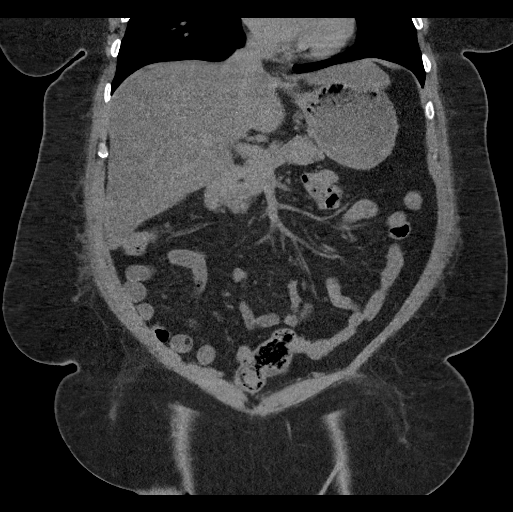

[17 of 46 positions shown; findings below may reference images not displayed]

FINDINGS: Evaluation of this exam is limited in the absence of intravenous
contrast.

Lower chest: The visualized lung bases are clear.

No intra-abdominal free air or free fluid.

Hepatobiliary: Diffuse fatty liver. No intrahepatic biliary ductal
dilatation. The gallbladder is unremarkable.

Pancreas: Unremarkable. No pancreatic ductal dilatation or
surrounding inflammatory changes.

Spleen: Normal in size without focal abnormality.

Adrenals/Urinary Tract: The adrenal glands unremarkable. There is a
6 mm stone in the proximal left ureter close to the UPJ. No
hydronephrosis. There is a 4 mm stone along the right posterior
bladder wall in the region of the ureterovesical junction which may
represent a UVJ stone or a recently passed calculus. There is mild
right hydronephrosis. No stone identified within the kidneys. The
urinary bladder is minimally distended.

Stomach/Bowel: There is no bowel obstruction or active inflammation.
The appendix is normal.

Vascular/Lymphatic: Minimal aortoiliac atherosclerotic disease. The
IVC is unremarkable. No portal venous gas. There is no adenopathy.

Reproductive: Hysterectomy. No adnexal masses.

Other: None

Musculoskeletal: No acute or significant osseous findings.
IMPRESSION: 1. A 4 mm right UVJ calculus with mild right hydronephrosis.
2. A 6 mm left UPJ stone.  No hydronephrosis on the left.
3. Fatty liver.
4. No bowel obstruction. Normal appendix.
5. Aortic Atherosclerosis ([id number]).
